# Patient Record
Sex: Male | Born: 1945 | Race: White | Hispanic: No
Health system: Southern US, Community
[De-identification: ages and names within clinical notes are randomized; demographics above are authoritative.]

---

## 2011-03-12 ENCOUNTER — Inpatient Hospital Stay: Payer: Self-pay | Admitting: *Deleted

## 2012-10-11 ENCOUNTER — Emergency Department: Payer: Self-pay | Admitting: Emergency Medicine

## 2012-10-11 LAB — URINALYSIS, COMPLETE
Bilirubin,UR: NEGATIVE
Blood: NEGATIVE
Nitrite: NEGATIVE
Ph: 6 (ref 4.5–8.0)
Protein: NEGATIVE
RBC,UR: 1 /HPF (ref 0–5)
Specific Gravity: 1.013 (ref 1.003–1.030)
Squamous Epithelial: NONE SEEN

## 2012-10-11 LAB — COMPREHENSIVE METABOLIC PANEL
Alkaline Phosphatase: 75 U/L (ref 50–136)
Anion Gap: 4 — ABNORMAL LOW (ref 7–16)
BUN: 18 mg/dL (ref 7–18)
Bilirubin,Total: 0.8 mg/dL (ref 0.2–1.0)
Chloride: 109 mmol/L — ABNORMAL HIGH (ref 98–107)
Co2: 28 mmol/L (ref 21–32)
Creatinine: 0.78 mg/dL (ref 0.60–1.30)
EGFR (African American): >60
EGFR (Non-African Amer.): >60
Glucose: 96 mg/dL (ref 65–99)
Potassium: 4.5 mmol/L (ref 3.5–5.1)
SGPT (ALT): 44 U/L (ref 12–78)

## 2012-10-11 LAB — CBC
HGB: 15.6 g/dL (ref 13.0–18.0)
Platelet: 148 10*3/uL — ABNORMAL LOW (ref 150–440)
RBC: 4.86 10*6/uL (ref 4.40–5.90)
RDW: 13 % (ref 11.5–14.5)
WBC: 9 10*3/uL (ref 3.8–10.6)

## 2013-09-19 DEATH — deceased

## 2014-07-14 NOTE — H&P (Signed)
PATIENT NAME:  Jeremiah Graves, Jeremiah Graves MR#:  [id number] DATE OF BIRTH:  [birth year]  DATE OF ADMISSION:  03/12/2011  PRIMARY CARE PHYSICIAN:  Nonlocal.  Patient is from [place], [place].    CHIEF COMPLAINT: Persistent cough since morning and generalized weakness.   HISTORY OF PRESENT ILLNESS:  History is obtained from patient's wife and some history is obtained from patient.  Jeremiah Graves is a pleasant 69 year old gentleman who has vascular dementia diagnosed in 2011, comes to the emergency room accompanied by his wife after he started having persistent cough since morning after eating sausage and biscuit. The patient has occasional "choking spell" per wife; however, this morning after he ate the sausage biscuit he started having some persistent cough. No vomiting. The patient is able to swallow, did have cough syrup and drank water at home. He did tolerate diet thereafter. In the emergency room he was found to be tachycardic, generalized weakness along with chest x-ray consistent with left lower lobe pneumonia versus atelectasis and elevated white count. Given his persistent cough with possible chance of aspiration, he is being admitted for further evaluation and management of systemic inflammatory response syndrome due to suspected aspiration pneumonia.   PAST MEDICAL HISTORY:  1. Vascular dementia. The patient has cerebral amyloid angiopathy diagnosed in 2011.  2. History of peptic ulcer disease, status post severe GI bleed in 1997 requiring endoscopy with cauterization of bleeding ulcers.  3. Depression.  4. History of basal cell carcinoma, status post removal of cancer lesions from the back and behind the right ear which was done recently.   FAMILY HISTORY: Positive for hypertension.   MEDICATIONS:  1. Namenda 10 mg daily.  2. Citalopram 40 mg at bedtime.  3. Risperdal 1 mg b.i.d.  4. MVI p.o. daily.  5. Fish oil 1000 mg daily.  6. Fish oil 1000 mg daily.   REVIEW OF SYSTEMS. CONSTITUTIONAL:  Positive for weakness and fatigue. EYES: No blurred or double vision. ENT: No tinnitus, ear pain, hearing loss. RESPIRATORY: Positive for  persistent cough since morning with some mild shortness of breath. CARDIOVASCULAR: No chest pain, no orthopnea or edema.  GI:  No nausea, vomiting, diarrhea, or abdominal pain. GU: No dysuria or hematuria. ENDOCRINE: No polyuria or nocturia. HEMATOLOGY: No anemia or easy bruising. SKIN: No acne or rash. MUSCULOSKELETAL: Positive for arthritis. NEUROLOGIC: No cerebrovascular accident, transient ischemic attack. Positive for vascular dementia. PSYCHIATRIC: The patient has depression. All other systems reviewed and negative.    PAST SURGICAL HISTORY: Removal of basal cell lesions from the back and behind the right ear.   ALLERGIES: No known drug allergies.   PHYSICAL EXAMINATION:  GENERAL: The patient is awake, alert, oriented x2, mild to moderate distress due to persistent cough.   VITAL SIGNS: Afebrile, pulse 108, blood pressure 136/77, saturations are 98% on 2L.   HEENT: Atraumatic, normocephalic. Pupils PERRLA, EOMI intact. Oral mucosa is moist.   NECK: Supple. No JVD. No carotid bruit.   RESPIRATORY: Coarse breath sounds bilaterally. No wheezing, respiratory distress, or use of accessory muscles.   CARDIOVASCULAR: Tachycardia present. Both the heart sounds are normal. Rhythm is regular Rate is tachycardic. No murmur heard. PMI not lateralized.   ABDOMEN: Soft, benign, nontender.  No organomegaly.  Positive bowel sounds.   NEUROLOGIC: Grossly intact cranial nerves II through 12. No motor or sensory deficits.   PSYCHIATRIC: The patient is awake, alert, oriented x2.   DATA: Chest x-ray, left lower lobe pneumonia versus atelectasis left base. Cardiac enzymes, first set  negative. White count is 20,000, hemoglobin is 15, hematocrit is 43.6. Glucose is 116, BUN is 22, creatinine is 1.19, sodium 138, potassium 4.4, chloride is 105, bicarbonate is 23, calcium is  8.9. LFTs within normal limits.   ASSESSMENT: This 69 year old Jeremiah Graves with :  1. Systemic inflammatory response syndrome suspected due to aspiration pneumonia. The patient has elevated white count, tachycardia, and chest x-ray suggestive of left lower lobe pneumonia/atelectasis.  2. Leukocytosis.  3. Vascular dementia with diagnosis of cerebral amyloid angiopathy.  This was made in 2011.  History of peptic ulcer disease with GI bleed.   PLAN:  1. Admit patient to telemetry floor.  2. IV fluids.  3. We will give 1 dose of IV Ativan 1 mg to see if patient gets some relief of the cough.   4. Tylenol as needed.  5. We will start patient on clindamycin 600 mg t.i.d. along with IV Levaquin 500 mg daily.  6. Follow blood cultures, sputum cultures.   7. Use suction p.r.n.  8. If patient's cough is persistent either consider ENT versus pulmonary evaluation.   9. DuoNeb as needed.  10. Resume home medications once evaluated by Speech in the morning.  11. Lovenox for deep vein thrombosis prophylaxis.   12. Above plan was discussed with the patient and the patient's wife.   CRITICAL TIME SPENT: 55 minutes.     ____________________________ Wylie HailSona A. Allena KatzPatel, MD sap:vtd D: 03/12/2011 22:34:38 ET T: 03/13/2011 05:20:23 ET JOB#: [id number]  cc: Dreana Britz A. Allena KatzPatel, MD, <Dictator> Willow OraSONA A Manreet Kiernan MD ELECTRONICALLY SIGNED 03/25/2011 14:43

## 2014-07-14 NOTE — Discharge Summary (Signed)
PATIENT NAME:  Jeremiah Graves, Jeremiah Graves MR#:  [id number] DATE OF BIRTH:  Dec 20, 1945  DATE OF ADMISSION:  03/12/2011 DATE OF DISCHARGE:  03/17/2011  DISCHARGE DIAGNOSES:  1. Acute respiratory failure secondary to aspiration pneumonia. 2. Sepsis secondary to aspiration pneumonia, resolved. 3. Vascular dementia with underlying cerebral amyloid angiopathy.  4. Hypokalemia.  5. Hypernatremia, resolved.  6. History of peptic ulcer disease.  7. Depression.   CONSULTATIONS:  1. Pulmonology, Dr. Meredeth Ide. 2. Speech therapy.   HOSPITAL COURSE: This is a 69 year old male with lives in [place], [place]. He was visiting his daughter in [place] and he presented with persistent cough since he ate a sausage and a biscuit prior to presentation. He occasionally has choking and strangulation spells with his food. He was suspected to have aspiration pneumonia. He was admitted as systemic inflammatory response syndrome secondary to aspiration pneumonia. When he came in, he was found to have a white count of 20,000. He was febrile. He was tachycardic. His chest x-ray shows pneumonia at the left lung base or atelectasis. He was started on Levaquin and clindamycin when he came in and he was admitted to the floor, but his respiratory status worsened overnight and he had to be intubated and transferred to Intensive Care Unit. He developed worsening crackles on his lung exam. His ABG was consistent with acute respiratory failure. He was intubated on 03/13/2011 and transferred to Intensive Care Unit. He was placed on the vent on assist-control. His ABG showed a pH of 7.27, pCO2 53, pO2 59 on 100% FiO2. This was prior to intubation. His chest x-ray shows worsening in appearance of the pulmonary interstitium. He was started on Zosyn and Zithromax. Pulmonology followed him. He had a lot of secretions coming from the ET tube also. He was kept n.p.o. The patient self extubated himself on 12/23. After self extubation, the patient was  doing well. He was not having any respiratory distress. He was kept n.p.o., watched in the Intensive Care Unit for another 24 hours and then transferred to the floor. Speech saw him and they started him on a dysphagia diet which he is tolerating well. His meds need to be crushed in puree. Diet instructions have been provided to the wife. Most likely he has a swallowing problem because of his dementia. Currently, he is saturating well on room air. He is saturating 96% on room air. He also was hypernatremic during the hospital stay and he was given some dextrose and his sodium has improved. His sodium on 12/25 was 143. He was hypokalemic also. His magnesium was checked. That was normal at 1.8. He has been given 40 mEq of potassium before discharge and I am going to give him potassium for a few days. He had mild diarrhea, probably because of antibiotics. Stool for C-difficile was negative. His urinalysis when he came in was negative. His sputum culture only grew  respiratory flora and his blood cultures were negative. His white count normalized to 9.3. He got about four days of antibiotic here and I am going to discharge him on Augmentin to complete 10 days of antibiotic. He needs to be set up with probably a speech therapist follow-up and a modified barium swallow as outpatient which can be done from his primary care physician's office.    DISCHARGE MEDICATIONS:  Home medications:  1. Citalopram 40 mg daily.  2. Fish oil 3 times a day.  3. Flaxseed oil. 4. Centrum silver. 5. Risperidone 2 mg twice a day. 6. Namenda.  New medications:  1. Augmentin 875 mg p.o. b.i.d. for six days. 2. Klor-Con potassium chloride packet 20 milliequivalents p.o. once daily for three days.   DIET: Mechanical soft, thin liquids. No straws. Strict aspiration precautions to include meds crushed in puree, as able, to be crushed. Monitoring at all meals, Sit fully upright with all oral intake. Must use small single bites, sips  when eating or drinking. Must eat slowly. Moisten all foods and chop ground meat for easier mastication and swallowing. Eat and drink only when fully awake, alert, and sitting at a table. No carbonated sodas during meals. These instructions have been provided to the wife. Also, I told the wife that with his dementia, his swallowing may worsen.   CONDITION AT DISCHARGE: He is comfortable. T-max 98, heart rate 71, blood pressure 147/82, saturating 96% on room air on 2 liters nasal cannula. Chest is clear. Heart sounds are regular. Abdomen soft, nontender. He was evaluated by physical therapy who recommended home with assist, so he is going to go home with his wife.   FOLLOWUP: The patient should follow up with Dr. [place] early next week. Also please follow up with by Dr. Delton SeeMoretz to be set up with home health with speech therapist and the patient can be referred to outpatient modified barium swallow from Dr. Lindaann SloughMoretz's office.   TIME SPENT ON DISCHARGE: 30 minutes.   ____________________________ Jeremiah SorrowAbhinav Selen Smucker, MD ag:ap D: 03/17/2011 13:21:48 ET T: 03/18/2011 11:59:15 ET JOB#: [id number]  cc: Jeremiah SorrowAbhinav Jaylene Schrom, MD, <Dictator> Dr. Britt Boozerhristian Moretz, [place], [place] Jeremiah SorrowABHINAV Madalen Gavin MD ELECTRONICALLY SIGNED 04/09/2011 12:05

## 2014-12-30 IMAGING — CT CT HEAD WITHOUT CONTRAST
1 of 2 series · 13 of 30 positions shown, 17 images · non-contrast
Comparison: none

REASON FOR EXAM: stroke like symptoms/weakness
COMMENTS:

PROCEDURE:     CT  - CT HEAD WITHOUT CONTRAST  - October 11, 2012  [DATE]
RESULT:     History: Stroke like symptoms.
Comparison Study: No prior.

[Series 5: soft tissue recon · axial · 0.39mm/px · z∈[-28,+111]mm · 13 of 34 slices shown, 17 images]
[im 3/34  brain]
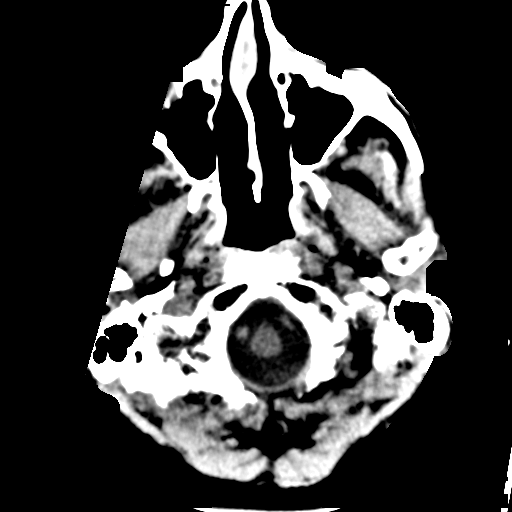
[im 3/34  bone]
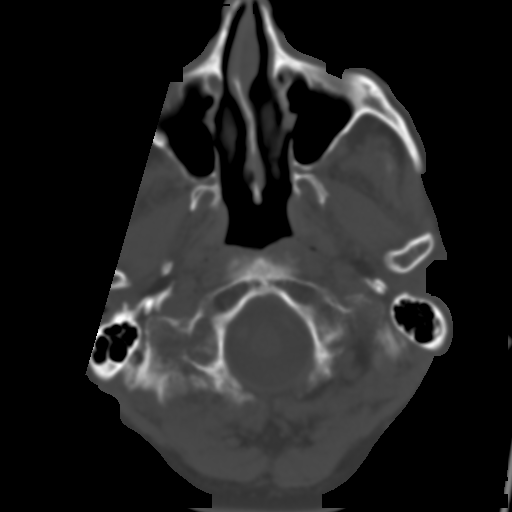
[im 5/34  brain]
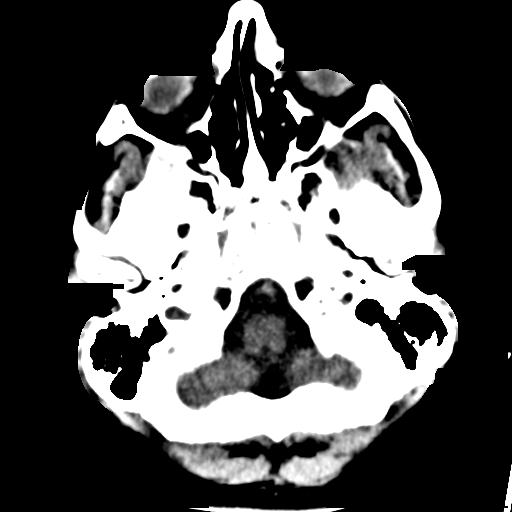
[im 8/34  brain]
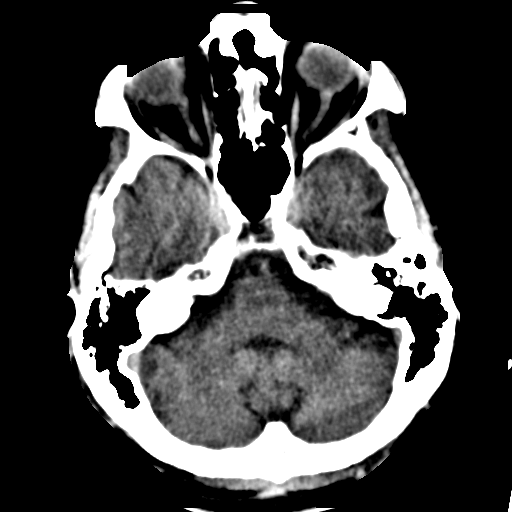
[im 10/34  brain]
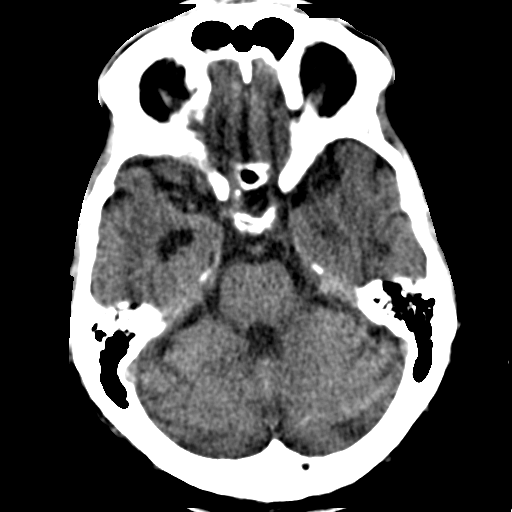
[im 12/34  brain]
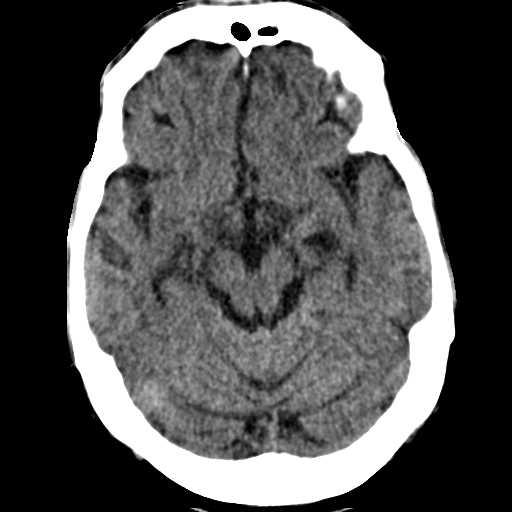
[im 12/34  bone]
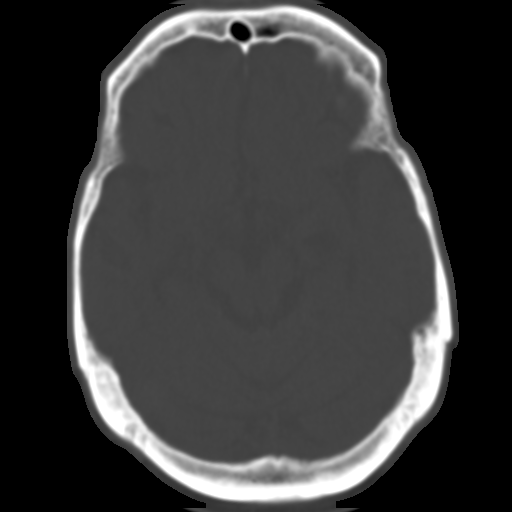
[im 15/34  brain]
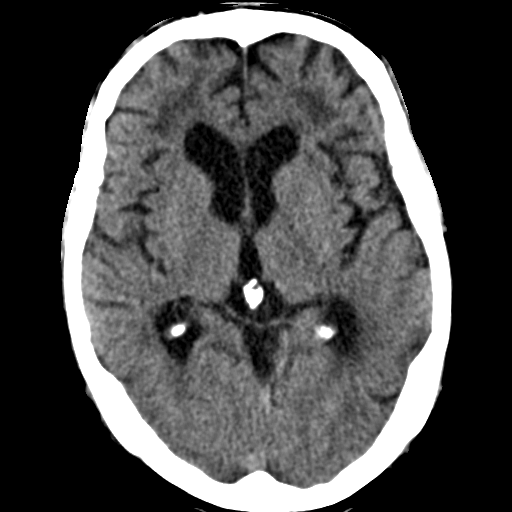
[im 17/34  brain]
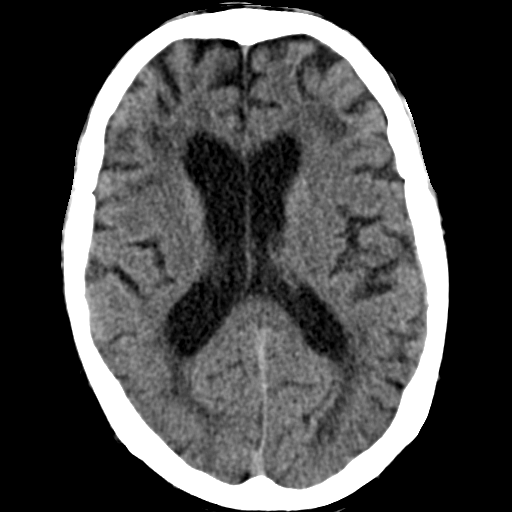
[im 19/34  brain]
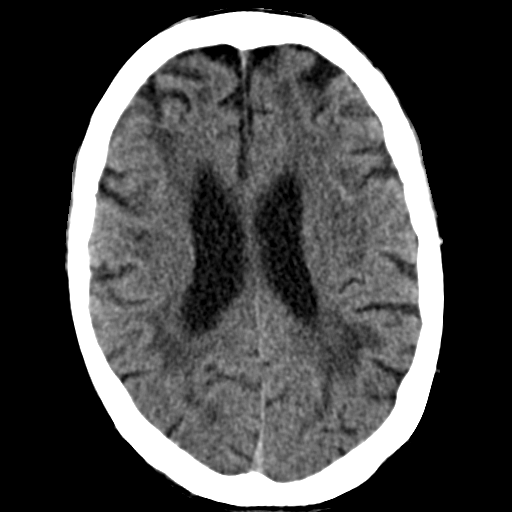
[im 22/34  brain]
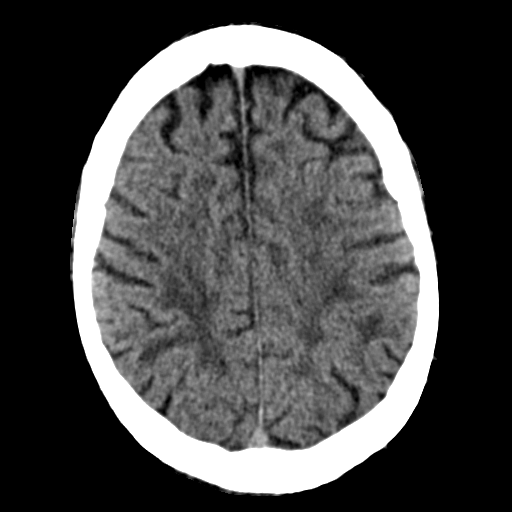
[im 22/34  bone]
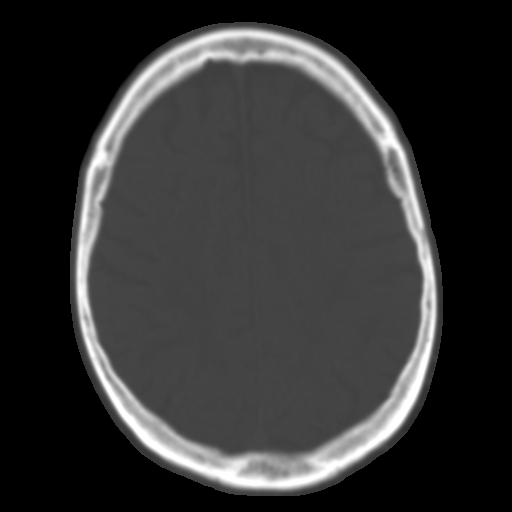
[im 24/34  brain]
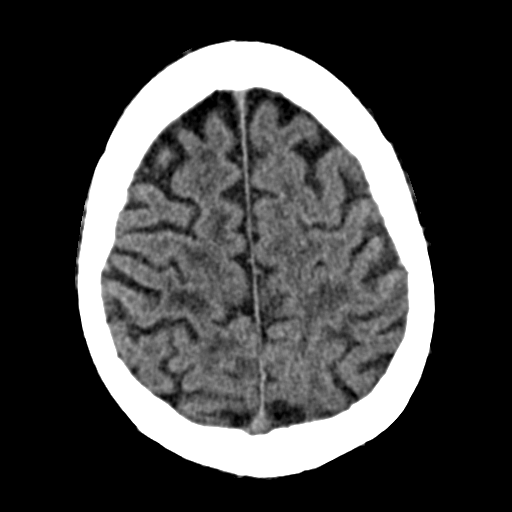
[im 26/34  brain]
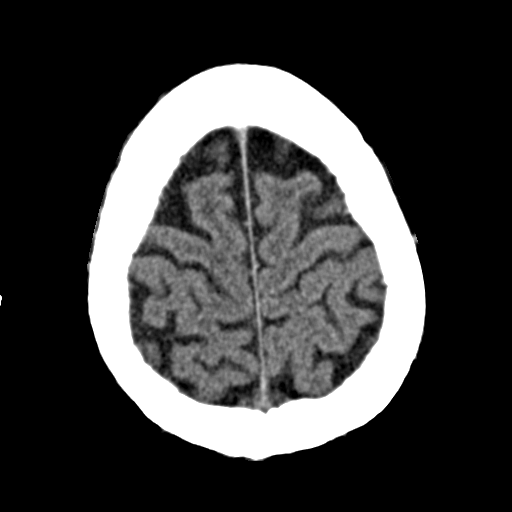
[im 29/34  brain]
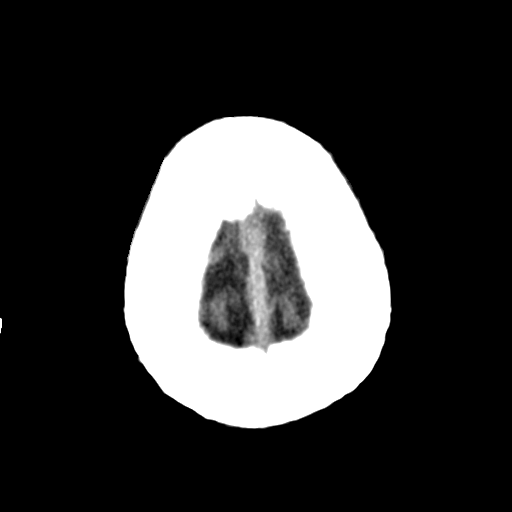
[im 31/34  brain]
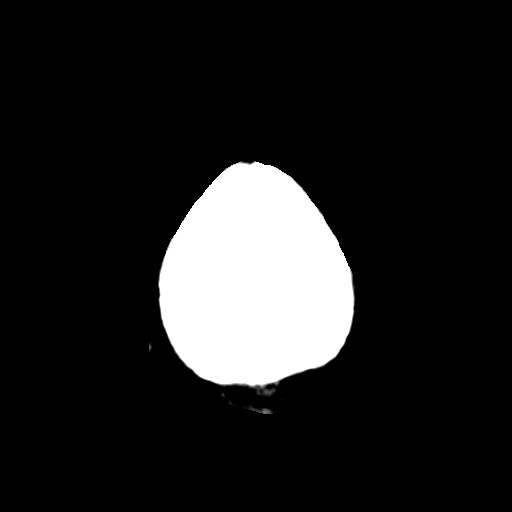
[im 31/34  bone]
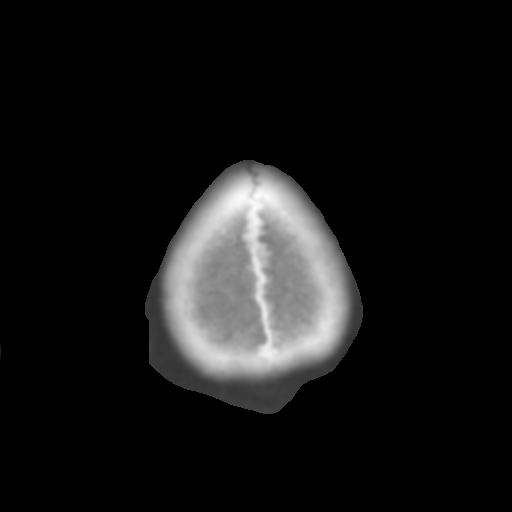

[13 of 30 positions shown; findings below may reference images not displayed]

FINDINGS: Standard nonenhanced CT obtained. No mass. Diffuse atrophy White
matter change consistent with chronic ischemia. Ventriculomegaly noted
consistent with degree of atrophy. No acute bony abnormality.
IMPRESSION: Diffuse atrophy and white matter changes consistent chronic
ischemia.

## 2015-01-21 DEATH — deceased
